# Patient Record
Sex: Male | Born: 1980 | Hispanic: Yes | Marital: Married | Smoking: Current every day smoker
Health system: Southern US, Community
[De-identification: ages and names within clinical notes are randomized; demographics above are authoritative.]

## PROBLEM LIST (undated history)

## (undated) DIAGNOSIS — S46219A Strain of muscle, fascia and tendon of other parts of biceps, unspecified arm, initial encounter: Secondary | ICD-10-CM

## (undated) HISTORY — PX: NO PAST SURGERIES: SHX2092

---

## 2001-02-28 ENCOUNTER — Emergency Department (HOSPITAL_COMMUNITY): Admission: EM | Admit: 2001-02-28 | Discharge: 2001-02-28 | Payer: Self-pay | Admitting: *Deleted

## 2008-05-12 ENCOUNTER — Emergency Department (HOSPITAL_COMMUNITY): Admission: EM | Admit: 2008-05-12 | Discharge: 2008-05-12 | Payer: Self-pay | Admitting: Emergency Medicine

## 2014-07-29 ENCOUNTER — Emergency Department (HOSPITAL_COMMUNITY)
Admission: EM | Admit: 2014-07-29 | Discharge: 2014-07-29 | Disposition: A | Discharge status: Home / Self Care | Payer: Self-pay | Attending: Emergency Medicine | Admitting: Emergency Medicine

## 2014-07-29 ENCOUNTER — Encounter (HOSPITAL_COMMUNITY): Payer: Self-pay | Admitting: Emergency Medicine

## 2014-07-29 DIAGNOSIS — S46201A Unspecified injury of muscle, fascia and tendon of other parts of biceps, right arm, initial encounter: Secondary | ICD-10-CM

## 2014-07-29 DIAGNOSIS — Y9389 Activity, other specified: Secondary | ICD-10-CM | POA: Insufficient documentation

## 2014-07-29 DIAGNOSIS — Y929 Unspecified place or not applicable: Secondary | ICD-10-CM | POA: Insufficient documentation

## 2014-07-29 DIAGNOSIS — S4991XA Unspecified injury of right shoulder and upper arm, initial encounter: Secondary | ICD-10-CM | POA: Insufficient documentation

## 2014-07-29 DIAGNOSIS — X58XXXA Exposure to other specified factors, initial encounter: Secondary | ICD-10-CM | POA: Insufficient documentation

## 2014-07-29 DIAGNOSIS — Y998 Other external cause status: Secondary | ICD-10-CM | POA: Insufficient documentation

## 2014-07-29 DIAGNOSIS — Z720 Tobacco use: Secondary | ICD-10-CM | POA: Insufficient documentation

## 2014-07-29 MED ORDER — OXYCODONE-ACETAMINOPHEN 5-325 MG PO TABS: 1.0000 | ORAL_TABLET | Freq: Four times a day (QID) | ORAL | Status: DC | PRN

## 2014-07-29 NOTE — Discharge Instructions (Signed)
Biceps Tendon Disruption (Distal) with Rehab The biceps tendon attaches the biceps muscle to the bones of the elbow and the shoulder. A distal biceps tendon disruption is a tear of this tendon at the end the attached near the elbow. A distal biceps tendon rupture is an uncommon injury. These injuries usually involve a complete tear of the tendon from the bone; however, partial tears are also possible. The bicep muscle works with other muscles to bend the elbow and rotate the palm upward (supinate). A complete biceps rupture will result in approximately a 30% decrease in elbow bending strength and a 40% decrease in one's ability to supinate the wrist. SYMPTOMS   Pain, tenderness, swelling, warmth, or redness at the elbow, usually in the front of the elbow.  Pain that worsens with flexion of the elbow against resistance and when straightening the elbow.  Bulge can be seen and felt in the arm.  Bruising (contusion) in the elbow or forearm after 24 hours.  Limited motion of the elbow.  Weakness with attempted elbow bending (lifting or carrying) or rotation of the wrist (like when using a screwdriver).  A crackling sound (crepitation) when the tendon or elbow is moved or touched. CAUSES  A biceps tendon rupture occurs when the tendon is subjected to a force that is greater than it can withstand, such as straightening the elbow while the biceps is contracted or direct trauma (rare). RISK INCREASES WITH:   Sports that involve contact, as well as throwing sports, gymnastics, weightlifting, and bodybuilding.  Heavy labor.  Poor strength and flexibility.  Failure to warm-up properly before activity. PREVENTION  Warm up and stretch appropriately before activity.  Maintain physical fitness:  Strength, flexibility, and endurance.  Cardiovascular fitness  Allow your body to recover between practices and competition.  Learn and use proper technique. PROGNOSIS  Surgery is usually  required to fix distal biceps tendon rupture. After surgery, a recovery period of 4 to 8 months can be expected to allow for healing and a return to sports.  RELATED COMPLICATIONS  Weakness of elbow bending and forearm rotation, especially if treated non-surgically.  Prolonged disability.  Re-rupture of the tendon after surgery.  Risks of surgery, including infection, bleeding, injury to nerves, elbow or wrist stiffness or loss of motion, and weakness of elbow bending or wrist rotation. TREATMENT  Treatment initially consists of ice and medication to help reduce pain and inflammation. A sling may also be worn to increase one's comfort. Surgery is required for a full recovery and return to sports. Surgery involves reattaching the tendon to the bone. Weakness can be expected if surgery is not performed; however, this may be acceptable for sedentary individuals. Surgery is usually followed by immobilization and rehabilitation exercises to regain strength and a full range of motion.  MEDICATION  If pain medication is necessary, nonsteroidal anti-inflammatory medications, such as aspirin and ibuprofen, or other minor pain relievers, such as acetaminophen, are often recommended.  Do not take pain medication for 7 days before surgery.  Prescription pain relievers may be given if deemed necessary by your caregiver. Use only as directed and only as much as you need. HEAT AND COLD  Cold treatment (icing) relieves pain and reduces inflammation. Cold treatment should be applied for 10 to 15 minutes every 2 to 3 hours for inflammation and pain and immediately after any activity that aggravates your symptoms. Use ice packs or an ice massage.  Heat treatment may be used prior to performing the stretching and strengthening  activities prescribed by your caregiver, physical therapist, or athletic trainer. Use a heat pack or a warm soak. SEEK MEDICAL CARE IF:   Symptoms get worse or do not improve in 2 weeks  despite treatment.  You experience pain, numbness, or coldness in the hand.  Blue, gray, or dark color appears in the fingernails.  Any of the following occur after surgery:  Increased pain, swelling, redness, drainage, or bleeding in the surgical area.  Signs of infection (headache, muscle aches, dizziness, or a general ill feeling with fever).  New, unexplained symptoms develop (drugs used in treatment may produce side effects). EXERCISES RANGE OF MOTION (ROM) AND STRETCHING EXERCISES - Biceps Tendon Disruption (Distal) Once your physician, physical therapist or athletic trainer has permitted you to discontinue using your brace or splint, you may begin to restore your elbow motion by using these exercises. Beginning these exercises before your provider's approval may result in delayed healing. While completing these exercises, remember:   Restoring tissue flexibility helps normal motion to return to the joints. This allows healthier, less painful movement and activity.  An effective stretch should be held for at least 30 seconds.  A stretch should never be painful. You should only feel a gentle lengthening or release in the stretched tissue. RANGE OF MOTION - Extension  Hold your right / left arm at your side and straighten your elbow as far as you can using your right / left arm muscles.  Straighten the right / left elbow farther by gently pushing down on your forearm until you feel a gentle stretch on the inside of your elbow. Hold this position for __________ seconds.  Slowly return to the starting position. Repeat __________ times. Complete this exercise __________ times per day.  RANGE OF MOTION - Flexion  Hold your right / left arm at your side and bend your elbow as far as you can using your right / left arm muscles.  Bend the right / left elbow farther by gently pushing up on your forearm until you feel a gentle stretch on the outside of your elbow. Hold this position for  __________ seconds.  Slowly return to the starting position. Repeat __________ times. Complete this exercise __________ times per day.  RANGE OF MOTION - Supination, Active   Stand or sit with your elbows at your side. Bend your right / left elbow to 90 degrees.  Turn your palm upward until you feel a gentle stretch on the inside of your forearm.  Hold this position for __________ seconds. Slowly release and return to the starting position. Repeat __________ times. Complete this stretch __________ times per day.  RANGE OF MOTION - Pronation, Active   Stand or sit with your elbows at your side. Bend your right / left elbow to 90 degrees.  Turn your palm downward until you feel a gentle stretch on the top of your forearm.  Hold this position for __________ seconds. Slowly release and return to the starting position. Repeat __________ times. Complete this stretch __________ times per day.  STRENGTHENING EXERCISES - Biceps Tendon Disruption (Distal) Once your physician, physical therapist, or athletic trainer has permitted you to discontinue using your brace or splint, you may begin restoring your arm strength by using these exercises. Beginning these before your provider's approval may result in delayed healing. While completing these exercises, remember:   Muscles can gain both the endurance and the strength needed for everyday activities through controlled exercises.  Complete these exercises as instructed by your physician,  physical therapist, or athletic trainer. Progress the resistance and repetitions only as guided.  You may experience muscle soreness or fatigue, but the pain or discomfort you are trying to eliminate should never worsen during these exercises. If this pain does worsen, stop and make certain you are following the directions exactly. If the pain is still present after adjustments, discontinue the exercise until you can discuss the trouble with your clinician. STRENGTH -  Elbow Flexors, Isometric   Stand or sit upright on a firm surface. Place your right / left arm so that your hand is palm-up and at the height of your waist.  Place your opposite hand on top of your forearm. Gently push down as your right / left arm resists. Push as hard as you can with both arms without causing any pain or movement at your right / left elbow. Hold this stationary position for __________ seconds.  Gradually release the tension in both arms. Allow your muscles to relax completely before repeating. Repeat __________ times. Complete this exercise __________ times per day. STRENGTH - Elbow Extensors, Isometric   Stand or sit upright on a firm surface. Place your right / left arm so that your palm faces your abdomen and it is at the height of your waist.  Place your opposite hand on the underside of your forearm. Gently push up as your right / left arm resists. Push as hard as you can with both arms without causing any pain or movement at your right / left elbow. Hold this stationary position for __________ seconds.  Gradually release the tension in both arms. Allow your muscles to relax completely before repeating. Repeat __________ times. Complete this exercise __________ times per day. STRENGTH - Elbow Flexors, Supinated  With good posture, stand or sit on a firm chair without armrests. Allow your right / left arm to rest at your side with your palm facing forward.  Holding a __________ weight or gripping a rubber exercise band/tubing, bring your right / left hand toward your shoulder.  Allow your muscles to control the resistance as your hand returns to your side. Repeat __________ times. Complete this exercise __________ times per day.  STRENGTH - Elbow Flexors, Neutral  With good posture, stand or sit on a firm chair without armrests. Allow your right / left arm to rest at your side with your thumb facing forward.  Holding a __________ weight or gripping a rubber exercise  band/tubing, bring your right / left hand toward your shoulder.  Allow your muscles to control the resistance as your hand returns to your side. Repeat __________ times. Complete this exercise __________ times per day.  STRENGTH - Elbow Extensors  Lie on your back. Extend your right / left elbow into the air, pointing it toward the ceiling. Brace your arm with your opposite hand.*  Holding a __________ weight in your hand, slowly straighten your right / left elbow.  Allow your muscles to control the weight as your hand returns to its starting position. Repeat __________ times. Complete this exercise __________ times per day. *You may also stand with your elbow overhead and pointed toward the ceiling and supported by your opposite hand. STRENGTH - Elbow Extensors, Dynamic  With good posture, stand or sit on a firm chair without armrests. Keeping your upper arms at your side, bring both hands up to your right / left shoulder while gripping a rubber exercise band/tubing. Your right / left hand should be just below the other hand.  Straighten your  right / left elbow. Hold for __________ seconds.  Allow your muscles to control the rubber exercise band/tubing as your hand returns to your shoulder. Repeat __________ times. Complete this exercise __________ times per day. STRENGTH - Forearm Supinators   Sit with your right / left forearm supported on a table, keeping your elbow below shoulder height. Rest your hand over the edge, palm down.  Gently grip a hammer or a soup ladle.  Without moving your elbow, slowly turn your palm and hand upward to a "thumbs-up" position.  Hold this position for __________ seconds. Slowly return to the starting position. Repeat __________ times. Complete this exercise __________ times per day.  STRENGTH - Forearm Pronators   Sit with your right / left forearm supported on a table, keeping your elbow below shoulder height. Rest your hand over the edge, palm  up.  Gently grip a hammer or a soup ladle.  Without moving your elbow, slowly turn your palm and hand upward to a "thumbs-up" position.  Hold this position for __________ seconds. Slowly return to the starting position. Repeat __________ times. Complete this exercise __________ times per day.  Document Released: 01/15/2005 Document Revised: 04/09/2011 Document Reviewed: 04/29/2008 [facility] Patient Information 2015 [place], [place]. This information is not intended to replace advice given to you by your health care provider. Make sure you discuss any questions you have with your health care provider.   Please follow-up with Dr. Eulah Pont for further evaluation and management. His monitor for new or worsening signs or symptoms return to the emergency room if any present.

## 2014-07-29 NOTE — ED Provider Notes (Signed)
CSN: [id number]     Arrival date & time 07/29/14  2201 History   This chart was scribed for non-physician practitioner, Eyvonne Mechanic, PA-C working with Lorre Nick, MD, by Abel Presto, ED Scribe. This patient was seen in room TR02C/TR02C and the patient's care was started at 10:40 PM.     Chief Complaint  Patient presents with  . Arm Pain     The history is provided by the patient. No language interpreter was used.   HPI Comments: Gerald Logan is a 34 y.o. male who presents to the Emergency Department complaining of right upper arm pain with onset 3 hours ago. Pt states he was lifting a piece of plywood at onset and heard a popping sound, after which his fingers became numb and tingly. Pt is unable to completely extend arm secondary to pain. Patient reports the numbness and tingling has improved, minimal pain at baseline. Patient denies previous injury to the right arm. Pt denies any other injury or complaint.   History reviewed. No pertinent past medical history. History reviewed. No pertinent past surgical history. No family history on file. History  Substance Use Topics  . Smoking status: Current Every Day Smoker  . Smokeless tobacco: Not on file  . Alcohol Use: Yes    Review of Systems  All other systems reviewed and are negative.   Allergies  Review of patient's allergies indicates no known allergies.  Home Medications   Prior to Admission medications   Medication Sig Start Date End Date Taking? Authorizing Provider  oxyCODONE-acetaminophen (PERCOCET/ROXICET) 5-325 MG per tablet Take 1 tablet by mouth every 6 (six) hours as needed for severe pain. 07/29/14   Katia Hannen, PA-C   BP 144/90 mmHg  Pulse 56  Temp(Src) 97.4 F (36.3 C)  Resp 20  SpO2 99%   Physical Exam  Constitutional: He is oriented to person, place, and time. He appears well-developed and well-nourished.  HENT:  Head: Normocephalic.  Eyes: Conjunctivae are normal.  Neck: Normal range of  motion. Neck supple.  Pulmonary/Chest: Effort normal.  Musculoskeletal: Normal range of motion.  Asymmetry of the biceps bilateral. Right distal bicep high riding tender to palpation. Patient has full flexion of the affected elbow and shoulder. Painful extension of the elbow. Strength 4 out of 5 elbow flexion. Shoulder nontender 5 out of 5 flexion strength. Distal sensation perfusion intact, strength 5 out of 5  Neurological: He is alert and oriented to person, place, and time.  Skin: Skin is warm and dry.  Psychiatric: He has a normal mood and affect. His behavior is normal.  Nursing note and vitals reviewed.   ED Course  Procedures (including critical care time) DIAGNOSTIC STUDIES: Oxygen Saturation is 98% on room air, normal by my interpretation.    COORDINATION OF CARE: 10:44 PM Discussed treatment plan with patient at beside, the patient agrees with the plan and has no further questions at this time.   Labs Review Labs Reviewed - No data to display  Imaging Review No results found.   EKG Interpretation None      MDM   Final diagnoses:  Unsp injury of musc/fasc/tend prt biceps, right arm, init    Patient presents with likely rupture of biceps tendon and insertion. Patient maintains strength of extremity, although it is reduced. 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 Patient will be placed in a sling, given pain medication, and consult information for on-call orthopedic surgeon Dr. Eulah Pont. He is instructed  to contact Dr. Eulah PontMurphy first thing tomorrow morning for further evaluation and management. Patient is given strict return precautions the event new or worsening signs or symptoms present. Patient verbalized understanding and agreement with today's plan has no further questions concerns.      I personally performed the services described in this documentation, which was scribed in my presence. The recorded information has  been reviewed and is accurate.     Eyvonne MechanicJeffrey Doral Digangi, PA-C 07/29/14 2325  Lorre NickAnthony Allen, MD 08/02/14 [phone]

## 2014-07-29 NOTE — ED Notes (Signed)
Pt. reports right upper arm injury with pain / stiffness this evening while lifting a heavy plywood .

## 2014-07-30 DIAGNOSIS — S46219A Strain of muscle, fascia and tendon of other parts of biceps, unspecified arm, initial encounter: Secondary | ICD-10-CM

## 2014-07-30 HISTORY — DX: Strain of muscle, fascia and tendon of other parts of biceps, unspecified arm, initial encounter: S46.219A

## 2014-08-03 ENCOUNTER — Encounter (HOSPITAL_BASED_OUTPATIENT_CLINIC_OR_DEPARTMENT_OTHER): Payer: Self-pay | Admitting: *Deleted

## 2014-08-03 NOTE — H&P (Signed)
  PREOPERATIVE H&P  Chief Complaint: SPONTANEOUS RUPTURE OF OTHER TENDONS,RIGHT UPPER ARM  HPI: Gerald DownerJavier Gotto is a 34 y.o. male who presents for preoperative history and physical with a diagnosis of SPONTANEOUS RUPTURE OF OTHER TENDONS,RIGHT UPPER ARM. Symptoms are rated as moderate to severe, and have been worsening.  This is significantly impairing activities of daily living.  He has elected for surgical management.   Past Medical History  Diagnosis Date  . Rupture of distal biceps tendon 07/2014    right   Past Surgical History  Procedure Laterality Date  . No past surgeries     History   Social History  . Marital Status: Married    Spouse Name: N/A  . Number of Children: N/A  . Years of Education: N/A   Social History Main Topics  . Smoking status: Current Every Day Smoker -- .5 years    Types: Cigarettes  . Smokeless tobacco: Never Used     Comment: 1-2 cig./week  . Alcohol Use: Yes     Comment: socially  . Drug Use: No  . Sexual Activity: Not on file   Other Topics Concern  . None   Social History Narrative   History reviewed. No pertinent family history. No Known Allergies Prior to Admission medications   Not on File     Positive ROS: All other systems have been reviewed and were otherwise negative with the exception of those mentioned in the HPI and as above.  Physical Exam: General: Alert, no acute distress Cardiovascular: No pedal edema Respiratory: No cyanosis, no use of accessory musculature GI: No organomegaly, abdomen is soft and non-tender Skin: No lesions in the area of chief complaint Neurologic: Sensation intact distally Psychiatric: Patient is competent for consent with normal mood and affect Lymphatic: No axillary or cervical lymphadenopathy  MUSCULOSKELETAL: Swelling of the right upper arm, diffusely tender to palpation, palpable deformity/retraction of the biceps tendon, very weak supination and flexion, sensation intact with 2+ distal  pulses  Assessment: SPONTANEOUS RUPTURE OF OTHER TENDONS,RIGHT UPPER ARM  Plan: Plan for Procedure(s): RIGHT DISTAL BICEPS TENDON REPAIR  The risks benefits and alternatives were discussed with the patient including but not limited to the risks of nonoperative treatment, versus surgical intervention including infection, bleeding, nerve injury,  blood clots, cardiopulmonary complications, morbidity, mortality, among others, and they were willing to proceed.   Lynann BolognaKelly,Shina Wass Marie, PA-C  08/03/2014 1:53 PM

## 2014-08-06 ENCOUNTER — Encounter (HOSPITAL_BASED_OUTPATIENT_CLINIC_OR_DEPARTMENT_OTHER): Payer: Self-pay | Admitting: Anesthesiology

## 2014-08-06 ENCOUNTER — Ambulatory Visit (HOSPITAL_BASED_OUTPATIENT_CLINIC_OR_DEPARTMENT_OTHER): Payer: Self-pay | Admitting: Anesthesiology

## 2014-08-06 ENCOUNTER — Ambulatory Visit (HOSPITAL_BASED_OUTPATIENT_CLINIC_OR_DEPARTMENT_OTHER)
Admission: RE | Admit: 2014-08-06 | Discharge: 2014-08-06 | Disposition: A | Discharge status: Home / Self Care | Payer: Self-pay | Source: Ambulatory Visit | Attending: Orthopedic Surgery | Admitting: Orthopedic Surgery

## 2014-08-06 ENCOUNTER — Encounter (HOSPITAL_BASED_OUTPATIENT_CLINIC_OR_DEPARTMENT_OTHER)
Admission: RE | Disposition: A | Discharge status: Home / Self Care | Payer: Self-pay | Source: Ambulatory Visit | Attending: Orthopedic Surgery

## 2014-08-06 DIAGNOSIS — M66821 Spontaneous rupture of other tendons, right upper arm: Secondary | ICD-10-CM | POA: Insufficient documentation

## 2014-08-06 DIAGNOSIS — F17210 Nicotine dependence, cigarettes, uncomplicated: Secondary | ICD-10-CM | POA: Insufficient documentation

## 2014-08-06 HISTORY — PX: DISTAL BICEPS TENDON REPAIR: SHX1461

## 2014-08-06 HISTORY — DX: Strain of muscle, fascia and tendon of other parts of biceps, unspecified arm, initial encounter: S46.219A

## 2014-08-06 LAB — POCT HEMOGLOBIN-HEMACUE: HEMOGLOBIN: 14.4 g/dL (ref 13.0–17.0)

## 2014-08-06 SURGERY — REPAIR, TENDON, BICEPS, DISTAL
Anesthesia: General | Site: Elbow | Laterality: Right

## 2014-08-06 MED ORDER — ASPIRIN EC 81 MG PO TBEC: 81.0000 mg | DELAYED_RELEASE_TABLET | Freq: Every day | ORAL | Status: AC

## 2014-08-06 MED ORDER — ONDANSETRON HCL 4 MG PO TABS: 4.0000 mg | ORAL_TABLET | Freq: Three times a day (TID) | ORAL | Status: AC | PRN

## 2014-08-06 MED ORDER — SODIUM CHLORIDE 0.9 % IR SOLN
Status: DC | PRN
  Administered 2014-08-06: 1000 mL

## 2014-08-06 MED ORDER — PROPOFOL 10 MG/ML IV BOLUS
INTRAVENOUS | Status: DC | PRN
  Administered 2014-08-06: 200 mg via INTRAVENOUS

## 2014-08-06 MED ORDER — LIDOCAINE HCL (CARDIAC) 20 MG/ML IV SOLN
INTRAVENOUS | Status: DC | PRN
  Administered 2014-08-06: 50 mg via INTRAVENOUS

## 2014-08-06 MED ORDER — CEFAZOLIN SODIUM-DEXTROSE 2-3 GM-% IV SOLR
2.0000 g | INTRAVENOUS | Status: AC
  Administered 2014-08-06: 2 g via INTRAVENOUS

## 2014-08-06 MED ORDER — CEFAZOLIN SODIUM-DEXTROSE 2-3 GM-% IV SOLR
INTRAVENOUS | Status: AC
  Filled 2014-08-06: qty 50

## 2014-08-06 MED ORDER — POTASSIUM CHLORIDE IN NACL 20-0.45 MEQ/L-% IV SOLN: INTRAVENOUS | Status: DC

## 2014-08-06 MED ORDER — MIDAZOLAM HCL 2 MG/2ML IJ SOLN
1.0000 mg | INTRAMUSCULAR | Status: DC | PRN
  Administered 2014-08-06: 2 mg via INTRAVENOUS

## 2014-08-06 MED ORDER — ROPIVACAINE HCL 5 MG/ML IJ SOLN
INTRAMUSCULAR | Status: DC | PRN
  Administered 2014-08-06: 25 mL via PERINEURAL

## 2014-08-06 MED ORDER — DOCUSATE SODIUM 100 MG PO CAPS: 100.0000 mg | ORAL_CAPSULE | Freq: Two times a day (BID) | ORAL | Status: AC

## 2014-08-06 MED ORDER — FENTANYL CITRATE (PF) 100 MCG/2ML IJ SOLN
50.0000 ug | INTRAMUSCULAR | Status: DC | PRN
  Administered 2014-08-06: 100 ug via INTRAVENOUS

## 2014-08-06 MED ORDER — METHOCARBAMOL 500 MG PO TABS: 500.0000 mg | ORAL_TABLET | Freq: Four times a day (QID) | ORAL | Status: AC

## 2014-08-06 MED ORDER — MIDAZOLAM HCL 2 MG/2ML IJ SOLN
INTRAMUSCULAR | Status: AC
  Filled 2014-08-06: qty 2

## 2014-08-06 MED ORDER — GLYCOPYRROLATE 0.2 MG/ML IJ SOLN: 0.2000 mg | Freq: Once | INTRAMUSCULAR | Status: DC | PRN

## 2014-08-06 MED ORDER — DEXAMETHASONE SODIUM PHOSPHATE 4 MG/ML IJ SOLN
INTRAMUSCULAR | Status: DC | PRN
  Administered 2014-08-06: 10 mg via INTRAVENOUS

## 2014-08-06 MED ORDER — ACETAMINOPHEN 500 MG PO TABS
1000.0000 mg | ORAL_TABLET | Freq: Once | ORAL | Status: AC
  Administered 2014-08-06: 1000 mg via ORAL

## 2014-08-06 MED ORDER — GLYCOPYRROLATE 0.2 MG/ML IJ SOLN
INTRAMUSCULAR | Status: DC | PRN
  Administered 2014-08-06: 0.2 mg via INTRAVENOUS

## 2014-08-06 MED ORDER — FENTANYL CITRATE (PF) 100 MCG/2ML IJ SOLN
INTRAMUSCULAR | Status: AC
  Filled 2014-08-06: qty 2

## 2014-08-06 MED ORDER — OXYCODONE-ACETAMINOPHEN 5-325 MG PO TABS: 1.0000 | ORAL_TABLET | ORAL | Status: AC | PRN

## 2014-08-06 MED ORDER — FENTANYL CITRATE (PF) 100 MCG/2ML IJ SOLN
INTRAMUSCULAR | Status: AC
  Filled 2014-08-06: qty 6

## 2014-08-06 MED ORDER — SCOPOLAMINE 1 MG/3DAYS TD PT72: 1.0000 | MEDICATED_PATCH | Freq: Once | TRANSDERMAL | Status: DC | PRN

## 2014-08-06 MED ORDER — LACTATED RINGERS IV SOLN
INTRAVENOUS | Status: DC
  Administered 2014-08-06 (×3): via INTRAVENOUS

## 2014-08-06 MED ORDER — CHLORHEXIDINE GLUCONATE 4 % EX LIQD: 60.0000 mL | Freq: Once | CUTANEOUS | Status: DC

## 2014-08-06 MED ORDER — ONDANSETRON HCL 4 MG/2ML IJ SOLN
INTRAMUSCULAR | Status: DC | PRN
  Administered 2014-08-06: 4 mg via INTRAVENOUS

## 2014-08-06 MED ORDER — ACETAMINOPHEN 500 MG PO TABS
ORAL_TABLET | ORAL | Status: AC
  Filled 2014-08-06: qty 2

## 2014-08-06 SURGICAL SUPPLY — 52 items
BANDAGE ELASTIC 4 VELCRO ST LF (GAUZE/BANDAGES/DRESSINGS) ×6 IMPLANT
BLADE SURG 15 STRL LF DISP TIS (BLADE) ×1 IMPLANT
BLADE SURG 15 STRL SS (BLADE) ×3
BNDG CMPR 9X4 STRL LF SNTH (GAUZE/BANDAGES/DRESSINGS) ×1
BNDG COHESIVE 4X5 TAN STRL (GAUZE/BANDAGES/DRESSINGS) ×3 IMPLANT
BNDG ESMARK 4X9 LF (GAUZE/BANDAGES/DRESSINGS) ×3 IMPLANT
CANISTER SUCT 1200ML W/VALVE (MISCELLANEOUS) ×3 IMPLANT
CHLORAPREP W/TINT 26ML (MISCELLANEOUS) ×3 IMPLANT
COVER BACK TABLE 60X90IN (DRAPES) ×3 IMPLANT
CUFF TOURNIQUET SINGLE 18IN (TOURNIQUET CUFF) ×2 IMPLANT
DRAPE EXTREMITY T 121X128X90 (DRAPE) ×3 IMPLANT
DRAPE OEC MINIVIEW 54X84 (DRAPES) ×2 IMPLANT
DRAPE U 20/CS (DRAPES) ×3 IMPLANT
DRAPE U-SHAPE 47X51 STRL (DRAPES) ×3 IMPLANT
DRSG EMULSION OIL 3X3 NADH (GAUZE/BANDAGES/DRESSINGS) ×2 IMPLANT
DRSG PAD ABDOMINAL 8X10 ST (GAUZE/BANDAGES/DRESSINGS) ×3 IMPLANT
ELECT REM PT RETURN 9FT ADLT (ELECTROSURGICAL) ×3
ELECTRODE REM PT RTRN 9FT ADLT (ELECTROSURGICAL) ×1 IMPLANT
GAUZE SPONGE 4X4 12PLY STRL (GAUZE/BANDAGES/DRESSINGS) ×3 IMPLANT
GLOVE BIO SURGEON STRL SZ7 (GLOVE) ×3 IMPLANT
GLOVE BIO SURGEON STRL SZ7.5 (GLOVE) ×3 IMPLANT
GLOVE BIOGEL PI IND STRL 7.0 (GLOVE) ×1 IMPLANT
GLOVE BIOGEL PI IND STRL 8 (GLOVE) ×2 IMPLANT
GLOVE BIOGEL PI INDICATOR 7.0 (GLOVE) ×2
GLOVE BIOGEL PI INDICATOR 8 (GLOVE) ×4
GOWN STRL REUS W/ TWL LRG LVL3 (GOWN DISPOSABLE) ×3 IMPLANT
GOWN STRL REUS W/TWL LRG LVL3 (GOWN DISPOSABLE) ×9
IMPL TOGGLELOC ELBOW SYSTEM (Orthopedic Implant) IMPLANT
IMPLANT TOGGLELOC ELBOW SYSTEM (Orthopedic Implant) ×3 IMPLANT
NS IRRIG 1000ML POUR BTL (IV SOLUTION) ×3 IMPLANT
PACK BASIN DAY SURGERY FS (CUSTOM PROCEDURE TRAY) ×3 IMPLANT
PAD CAST 4YDX4 CTTN HI CHSV (CAST SUPPLIES) ×3 IMPLANT
PADDING CAST ABS 4INX4YD NS (CAST SUPPLIES) ×2
PADDING CAST ABS COTTON 4X4 ST (CAST SUPPLIES) ×1 IMPLANT
PADDING CAST COTTON 4X4 STRL (CAST SUPPLIES) ×9
PENCIL BUTTON HOLSTER BLD 10FT (ELECTRODE) ×3 IMPLANT
SLEEVE SCD COMPRESS KNEE MED (MISCELLANEOUS) ×2 IMPLANT
SLING ARM LRG ADULT FOAM STRAP (SOFTGOODS) ×2 IMPLANT
SUCTION FRAZIER TIP 10 FR DISP (SUCTIONS) ×3 IMPLANT
SUT ETHILON 3 0 PS 1 (SUTURE) ×3 IMPLANT
SUT FIBERWIRE #2 38 T-5 BLUE (SUTURE) ×3
SUT VIC AB 0 CT1 27 (SUTURE) ×3
SUT VIC AB 0 CT1 27XBRD ANBCTR (SUTURE) ×1 IMPLANT
SUT VIC AB 2-0 SH 27 (SUTURE) ×3
SUT VIC AB 2-0 SH 27XBRD (SUTURE) ×1 IMPLANT
SUTURE FIBERWR #2 38 T-5 BLUE (SUTURE) IMPLANT
SYR BULB 3OZ (MISCELLANEOUS) ×3 IMPLANT
TOWEL OR 17X24 6PK STRL BLUE (TOWEL DISPOSABLE) ×5 IMPLANT
TOWEL OR NON WOVEN STRL DISP B (DISPOSABLE) ×3 IMPLANT
TUBE CONNECTING 20'X1/4 (TUBING) ×1
TUBE CONNECTING 20X1/4 (TUBING) ×2 IMPLANT
UNDERPAD 30X30 (UNDERPADS AND DIAPERS) ×3 IMPLANT

## 2014-08-06 NOTE — Discharge Instructions (Signed)
Keep splint clean and dry till follow up  Non-weight bearing in the R arm  Sling at all times  ASA 81mg  daily for DVT prophylaxis   Post Anesthesia Home Care Instructions  Activity: Get plenty of rest for the remainder of the day. A responsible adult should stay with you for 24 hours following the procedure.  For the next 24 hours, DO NOT: -Drive a car -Advertising copywriterperate machinery -Drink alcoholic beverages -Take any medication unless instructed by your physician -Make any legal decisions or sign important papers.  Meals: Start with liquid foods such as gelatin or soup. Progress to regular foods as tolerated. Avoid greasy, spicy, heavy foods. If nausea and/or vomiting occur, drink only clear liquids until the nausea and/or vomiting subsides. Call your physician if vomiting continues.  Special Instructions/Symptoms: Your throat may feel dry or sore from the anesthesia or the breathing tube placed in your throat during surgery. If this causes discomfort, gargle with warm salt water. The discomfort should disappear within 24 hours.  If you had a scopolamine patch placed behind your ear for the management of post- operative nausea and/or vomiting:  1. The medication in the patch is effective for 72 hours, after which it should be removed.  Wrap patch in a tissue and discard in the trash. Wash hands thoroughly with soap and water. 2. You may remove the patch earlier than 72 hours if you experience unpleasant side effects which may include dry mouth, dizziness or visual disturbances. 3. Avoid touching the patch. Wash your hands with soap and water after contact with the patch.     Regional Anesthesia Blocks  1. Numbness or the inability to move the "blocked" extremity may last from 3-48 hours after placement. The length of time depends on the medication injected and your individual response to the medication. If the numbness is not going away after 48 hours, call your surgeon.  2. The extremity  that is blocked will need to be protected until the numbness is gone and the  Strength has returned. Because you cannot feel it, you will need to take extra care to avoid injury. Because it may be weak, you may have difficulty moving it or using it. You may not know what position it is in without looking at it while the block is in effect.  3. For blocks in the legs and feet, returning to weight bearing and walking needs to be done carefully. You will need to wait until the numbness is entirely gone and the strength has returned. You should be able to move your leg and foot normally before you try and bear weight or walk. You will need someone to be with you when you first try to ensure you do not fall and possibly risk injury.  4. Bruising and tenderness at the needle site are common side effects and will resolve in a few days.  5. Persistent numbness or new problems with movement should be communicated to the surgeon or the [facility]  [phone])/ [facility] [phone]).

## 2014-08-06 NOTE — Op Note (Signed)
08/06/2014  2:38 PM  PATIENT:  Gerald Logan    PRE-OPERATIVE DIAGNOSIS:  SPONTANEOUS RUPTURE OF OTHER TENDONS,RIGHT UPPER ARM  POST-OPERATIVE DIAGNOSIS:  Same  PROCEDURE:  RIGHT DISTAL BICEPS TENDON REPAIR  SURGEON:  Meeah Totino, Jewel BaizeIMOTHY D, MD  ASSISTANT: Janalee DaneBrittney Kelly, PA-C, She was present and scrubbed throughout the case, critical for completion in a timely fashion, and for retraction, instrumentation, and closure.   ANESTHESIA:   gen   PREOPERATIVE INDICATIONS:  Gerald Logan is a  34 y.o. male with a diagnosis of SPONTANEOUS RUPTURE OF OTHER TENDONS,RIGHT UPPER ARM who failed conservative measures and elected for surgical management.    The risks benefits and alternatives were discussed with the patient preoperatively including but not limited to the risks of infection, bleeding, nerve injury, cardiopulmonary complications, the need for revision surgery, among others, and the patient was willing to proceed.  OPERATIVE IMPLANTS: biomet zip loop  OPERATIVE FINDINGS: tendon rupture  BLOOD LOSS: min  COMPLICATIONS: none  TOURNIQUET TIME: 90min  OPERATIVE PROCEDURE:  Patient was identified in the preoperative holding area and site was marked by me He was transported to the operating theater and placed on the table in supine position taking care to pad all bony prominences. After a preincinduction time out anesthesia was induced. The right upper extremity was prepped and draped in normal sterile fashion and a pre-incision timeout was performed. He received ancef for preoperative antibiotics.   I made a longitudinal incision over his anterior forearm and the approach of Sherilyn CooterHenry down to his radial tuberosity. I kept his arm supinated throughout the procedure.  I identified several veins and protected these.  Identified his radial tuberosity and it was found to be bare without tendon attachment. Cleared off the remaining tendon fibers for future passage of our zip loop button. Next I  turned my attention proximally I bluntly dissected out from this origin and identified his radial aspect of his biceps tendon.  I delivered this out of the wound placed mouth clamp and whipstitched it up and back securing the zip limited to zip loop to the tendon.  Next I drilled a guide pin through the radial tuberosity is I took multiple x-rays and was happy with placement and alignment.  I then drilled the tunnel for the zip loop button.  Next I selected an 8 mm reamer to drill unicortical he to dunk the tendon tended into the tunnel.  I passed the button through the cortex confirm that it flipped with multiple x-rays. I then walked the tendon down into the tunnel tensioning the zip loop. There was at least a centimeter of tendon dumped into the tunnel.  I then over tied the zip loop stitches.  I then thoroughly irrigated the wound and closed the skin with a nylon stitch. Sterile dressing was applied he was splinted at 90.  POST OPERATIVE PLAN: splint full time. Ambulate for dvt px.     This note was generated using a template and dragon dictation system. In light of that, I have reviewed the note and all aspects of it are applicable to this case. Any dictation errors are due to the computerized dictation system.

## 2014-08-06 NOTE — Progress Notes (Signed)
Assisted Dr. Rose with right, ultrasound guided, supraclavicular block. Side rails up, monitors on throughout procedure. See vital signs in flow sheet. Tolerated Procedure well. 

## 2014-08-06 NOTE — Transfer of Care (Signed)
Immediate Anesthesia Transfer of Care Note  Patient: Gerald Logan  Procedure(s) Performed: Procedure(s): RIGHT DISTAL BICEPS TENDON REPAIR (Right)  Patient Location: PACU  Anesthesia Type:GA combined with regional for post-op pain  Level of Consciousness: sedated  Airway & Oxygen Therapy: Patient Spontanous Breathing and Patient connected to face mask oxygen  Post-op Assessment: Report given to RN and Post -op Vital signs reviewed and stable  Post vital signs: Reviewed and stable  Last Vitals:  Filed Vitals:   08/06/14 1500  BP: 117/75  Pulse: 58  Temp:   Resp:     Complications: No apparent anesthesia complications

## 2014-08-06 NOTE — Anesthesia Procedure Notes (Addendum)
Anesthesia Regional Block:  Supraclavicular block  Pre-Anesthetic Checklist: ,, timeout performed, Correct Patient, Correct Site, Correct Laterality, Correct Procedure, Correct Position, site marked, Risks and benefits discussed,  Surgical consent,  Pre-op evaluation,  At surgeon's request and post-op pain management  Laterality: Right  Prep: chloraprep       Needles:  Injection technique: Single-shot  Needle Type: Echogenic Stimulator Needle     Needle Length: 9cm 9 cm Needle Gauge: 21 and 21 G    Additional Needles:  Procedures: ultrasound guided (picture in chart) Supraclavicular block Narrative:  Injection made incrementally with aspirations every 5 mL.  Performed by: Personally   Additional Notes: Risks, benefits and alternative to block explained extensively.  Patient tolerated procedure well, without complications.   Procedure Name: LMA Insertion Date/Time: 08/06/2014 12:55 PM Performed by: Genevieve NorlanderLINKA, Holley Kocurek L Pre-anesthesia Checklist: Patient identified, Emergency Drugs available, Suction available and Patient being monitored Patient Re-evaluated:Patient Re-evaluated prior to inductionOxygen Delivery Method: Circle System Utilized Preoxygenation: Pre-oxygenation with 100% oxygen Intubation Type: IV induction Ventilation: Mask ventilation without difficulty LMA: LMA inserted LMA Size: 5.0 Number of attempts: 1 Airway Equipment and Method: Bite block Placement Confirmation: positive ETCO2 Tube secured with: Tape Dental Injury: Teeth and Oropharynx as per pre-operative assessment

## 2014-08-06 NOTE — Anesthesia Preprocedure Evaluation (Signed)
Anesthesia Evaluation  Patient identified by MRN, date of birth, ID band Patient awake    Reviewed: Allergy & Precautions, NPO status , Patient's Chart, lab work & pertinent test results  Airway Mallampati: II  TM Distance: >3 FB Neck ROM: Full    Dental no notable dental hx.    Pulmonary Current Smoker,  breath sounds clear to auscultation  Pulmonary exam normal       Cardiovascular negative cardio ROS Normal cardiovascular examRhythm:Regular Rate:Normal     Neuro/Psych negative neurological ROS  negative psych ROS   GI/Hepatic negative GI ROS, Neg liver ROS,   Endo/Other  negative endocrine ROS  Renal/GU negative Renal ROS  negative genitourinary   Musculoskeletal negative musculoskeletal ROS (+)   Abdominal   Peds negative pediatric ROS (+)  Hematology negative hematology ROS (+)   Anesthesia Other Findings   Reproductive/Obstetrics negative OB ROS                             Anesthesia Physical Anesthesia Plan  ASA: II  Anesthesia Plan: General   Post-op Pain Management:    Induction: Intravenous  Airway Management Planned: LMA  Additional Equipment:   Intra-op Plan:   Post-operative Plan: Extubation in OR  Informed Consent: I have reviewed the patients History and Physical, chart, labs and discussed the procedure including the risks, benefits and alternatives for the proposed anesthesia with the patient or authorized representative who has indicated his/her understanding and acceptance.   Dental advisory given  Plan Discussed with: CRNA and Surgeon  Anesthesia Plan Comments:         Anesthesia Quick Evaluation

## 2014-08-06 NOTE — Interval H&P Note (Signed)
History and Physical Interval Note:  08/06/2014 7:57 AM  Gerald Logan  has presented today for surgery, with the diagnosis of SPONTANEOUS RUPTURE OF OTHER TENDONS,RIGHT UPPER ARM  The various methods of treatment have been discussed with the patient and family. After consideration of risks, benefits and other options for treatment, the patient has consented to  Procedure(s): RIGHT DISTAL BICEPS TENDON REPAIR (Right) as a surgical intervention .  The patient's history has been reviewed, patient examined, no change in status, stable for surgery.  I have reviewed the patient's chart and labs.  Questions were answered to the patient's satisfaction.     Shawanna Zanders D

## 2014-08-06 NOTE — Anesthesia Postprocedure Evaluation (Signed)
  Anesthesia Post-op Note  Patient: Gerald Logan  Procedure(s) Performed: Procedure(s): RIGHT DISTAL BICEPS TENDON REPAIR (Right)  Patient Location: PACU  Anesthesia Type:GA combined with regional for post-op pain  Level of Consciousness: awake and alert   Airway and Oxygen Therapy: Patient Spontanous Breathing  Post-op Pain: none  Post-op Assessment: Post-op Vital signs reviewed              Post-op Vital Signs: Reviewed  Last Vitals:  Filed Vitals:   08/06/14 1530  BP: 115/55  Pulse: 44  Temp:   Resp: 10    Complications: No apparent anesthesia complications

## 2014-08-09 ENCOUNTER — Encounter (HOSPITAL_BASED_OUTPATIENT_CLINIC_OR_DEPARTMENT_OTHER): Payer: Self-pay | Admitting: Orthopedic Surgery

## 2016-08-26 ENCOUNTER — Encounter (HOSPITAL_COMMUNITY): Payer: Self-pay | Admitting: Emergency Medicine

## 2016-08-26 ENCOUNTER — Ambulatory Visit (HOSPITAL_COMMUNITY)
Admission: EM | Admit: 2016-08-26 | Discharge: 2016-08-26 | Disposition: A | Discharge status: Home / Self Care | Payer: Self-pay | Attending: Internal Medicine | Admitting: Internal Medicine

## 2016-08-26 DIAGNOSIS — T1592XA Foreign body on external eye, part unspecified, left eye, initial encounter: Secondary | ICD-10-CM

## 2016-08-26 MED ORDER — TETRACAINE HCL 0.5 % OP SOLN
OPHTHALMIC | Status: AC
  Filled 2016-08-26: qty 4

## 2016-08-26 MED ORDER — FLUORESCEIN SODIUM 0.6 MG OP STRP
ORAL_STRIP | OPHTHALMIC | Status: AC
  Filled 2016-08-26: qty 1

## 2016-08-26 NOTE — ED Triage Notes (Signed)
Patient was using a skill saw, no protective eye wear and felt something in left eye.  Patient says eye hurts, but vision is clear, denies blurriness, denies any black fields of vision

## 2017-09-17 DIAGNOSIS — S82891C Other fracture of right lower leg, initial encounter for open fracture type IIIA, IIIB, or IIIC: Secondary | ICD-10-CM | POA: Insufficient documentation

## 2017-09-17 DIAGNOSIS — S82141A Displaced bicondylar fracture of right tibia, initial encounter for closed fracture: Secondary | ICD-10-CM | POA: Insufficient documentation

## 2018-02-27 NOTE — Progress Notes (Signed)
 ORTHOPAEDIC TRAUMA RETURN CLINIC NOTE  Date: 02/27/2018  Attending: Dr. Ernestine   ASSESSMENT: Gerald Logan is a 38 y.o. male with the following:  Injury: Right ankle and right lateral tibial plateau fractures Date of injury: 09/16/2017 Surgery: ORIF of right bimalleolar ankle fracture, ORIF of lateral tibial plateau fracture with single partially-threaded screw Date of surgery: 09/18/2017  PLAN: We had a good discussion with the patient today.  He is progressing quite well.  We do recommend that he obtain an over-the-counter lace up ankle brace from [employer] as he does intermittently twist his ankle.  We also provided the patient with a work release for full duty.  We will follow-up on as-needed basis  Requested Prescriptions    No prescriptions requested or ordered in this encounter    Scheduling Notes: Follow up: Return if symptoms worsen or fail to improve.   SUBJECTIVE: Chief complaint: Follow-up    History of Present Illness:  38 y.o. male who is for routine follow-up of his above-mentioned injuries.  The patient notes that he is doing much better than his last visit.  He states that his pain is adequately controlled at this time.  He has been doing home exercises for range of motion strengthening of his right knee and ankle.  He states that he is slightly twisted his ankle this morning, which is why it is swollen.  Prior to that it was the same size as his left ankle.  He has not been requiring any sort of assist device.  He is interested in returning to work at this point.     Review of Systems No fevers, no SOB   Patient Active Problem List  Diagnosis   Type III open fracture of right ankle   Closed fracture of right tibial plateau    OBJECTIVE: EXPANDED PHYSICAL EXAM (6 Point) General Appearance well-nourished and no acute distress, Estimated body mass index is 31.19 kg/m as calculated from the following:   Height as of 09/16/17: 182.9 cm (6').   Weight as of 09/16/17:  104.3 kg (230 lb).  Mood and Affect alert, cooperative and pleasant  Cardiovascular well-perfused distally and no swelling  Sensation sensation to light touch distally normal   MUSCULOSKELETAL   Right lower extremity  Incisions well-healed.  Nontender palpation about the knee or ankle.  Symmetric knee range of motion compared to contralateral side.  Palpable patellofemoral crepitation.  Right ankle 15 degrees dorsiflexion and 20 degrees plantarflexion with knee extended.  Strength 4+/5.  Sensation intact light touch in the sural, saphenous, superficial peroneal, deep peroneal, and tibial nerve distributions.  Toes are warm and well-perfused with brisk cap refill.  Ambulates with a minimally antalgic gait.   Test Results Imaging No new films today.    No orders of the defined types were placed in this encounter.   Note created by [name] Feeling, February 27, 2018 9:49 AM

## 2020-09-15 ENCOUNTER — Ambulatory Visit (HOSPITAL_COMMUNITY)
Admission: EM | Admit: 2020-09-15 | Discharge: 2020-09-15 | Disposition: A | Discharge status: Home / Self Care | Payer: Self-pay | Attending: Family Medicine | Admitting: Family Medicine

## 2020-09-15 ENCOUNTER — Other Ambulatory Visit: Payer: Self-pay

## 2020-09-15 ENCOUNTER — Encounter (HOSPITAL_COMMUNITY): Payer: Self-pay | Admitting: Emergency Medicine

## 2020-09-15 DIAGNOSIS — H1032 Unspecified acute conjunctivitis, left eye: Secondary | ICD-10-CM

## 2020-09-15 MED ORDER — ERYTHROMYCIN 5 MG/GM OP OINT: TOPICAL_OINTMENT | OPHTHALMIC | 0 refills | Status: AC

## 2020-09-15 NOTE — ED Triage Notes (Signed)
Pt said today he woke up with irritation to left eye and is watering. Very painful No injury

## 2020-09-19 NOTE — ED Provider Notes (Signed)
[facility]    CSN: [id number] Arrival date & time: 09/15/20  1929      History   Chief Complaint Chief Complaint  Patient presents with   Eye Pain    HPI Gerald Logan is a 40 y.o. male.   Medical interpreter utilized to facilitate visit with patient's consent.  He states he woke up this morning with left eye irritation, clear watering, itching.  He denies any injury or substances to the eye.  Some photophobia, no headache, nausea, vomiting, blurry vision, floaters, fevers.  Has had something like this several years ago and was treated at an urgent care at that time as well.   Past Medical History:  Diagnosis Date   Rupture of distal biceps tendon 07/2014   right    There are no problems to display for this patient.   Past Surgical History:  Procedure Laterality Date   DISTAL BICEPS TENDON REPAIR Right 08/06/2014   Procedure: RIGHT DISTAL BICEPS TENDON REPAIR;  Surgeon: Sheral Apley, MD;  Location: [facility];  Service: Orthopedics;  Laterality: Right;   NO PAST SURGERIES       Home Medications    Prior to Admission medications   Medication Sig Start Date End Date Taking? Authorizing Provider  erythromycin ophthalmic ointment Place a 1/2 inch ribbon of ointment into the left lower eyelid BID. 09/15/20  Yes Particia Nearing, PA-C  aspirin EC 81 MG tablet Take 1 tablet (81 mg total) by mouth daily. 08/06/14   Janalee Dane, PA-C  docusate sodium (COLACE) 100 MG capsule Take 1 capsule (100 mg total) by mouth 2 (two) times daily. 08/06/14   Janalee Dane, PA-C  methocarbamol (ROBAXIN) 500 MG tablet Take 1 tablet (500 mg total) by mouth 4 (four) times daily. 08/06/14   Janalee Dane, PA-C  ondansetron (ZOFRAN) 4 MG tablet Take 1 tablet (4 mg total) by mouth every 8 (eight) hours as needed for nausea or vomiting. 08/06/14   Janalee Dane, PA-C  oxyCODONE-acetaminophen (PERCOCET) 5-325 MG per tablet Take 1-2 tablets by mouth every 4 (four)  hours as needed for severe pain. 08/06/14   Janalee Dane, PA-C    Family History History reviewed. No pertinent family history.  Social History Social History   Tobacco Use   Smoking status: Every Day    Years: 0.50    Types: Cigarettes   Smokeless tobacco: Never   Tobacco comments:    1-2 cig./week  Substance Use Topics   Alcohol use: Yes    Comment: socially   Drug use: No     Allergies   Patient has no known allergies.   Review of Systems Review of Systems Per HPI  Physical Exam Triage Vital Signs ED Triage Vitals  Enc Vitals Group     BP 09/15/20 2013 113/90     Pulse Rate 09/15/20 2013 84     Resp 09/15/20 2013 16     Temp --      Temp src --      SpO2 09/15/20 2013 98 %     Weight --      Height --      Head Circumference --      Peak Flow --      Pain Score 09/15/20 2014 2     Pain Loc --      Pain Edu? --      Excl. in GC? --    No data found.  Updated Vital Signs BP 113/90 (  BP Location: Right Arm)   Pulse 84   Resp 16   SpO2 98%   Visual Acuity Right Eye Distance: 20/20 Left Eye Distance: 20/25 Bilateral Distance:    Right Eye Near:   Left Eye Near:    Bilateral Near:     Physical Exam Vitals and nursing note reviewed.  Constitutional:      Appearance: Normal appearance.  HENT:     Head: Atraumatic.     Nose: Nose normal.     Mouth/Throat:     Mouth: Mucous membranes are moist.     Pharynx: Oropharynx is clear.  Eyes:     General:        Left eye: Discharge present.    Extraocular Movements: Extraocular movements intact.     Pupils: Pupils are equal, round, and reactive to light.     Comments: Diffuse left conjunctival erythema, injection.  Clear discharge present  Cardiovascular:     Rate and Rhythm: Normal rate and regular rhythm.  Pulmonary:     Effort: Pulmonary effort is normal.     Breath sounds: Normal breath sounds.  Musculoskeletal:        General: Normal range of motion.     Cervical back: Normal range of  motion and neck supple.  Skin:    General: Skin is warm and dry.  Neurological:     General: No focal deficit present.     Mental Status: He is oriented to person, place, and time.  Psychiatric:        Mood and Affect: Mood normal.        Thought Content: Thought content normal.        Judgment: Judgment normal.     UC Treatments / Results  Labs (all labs ordered are listed, but only abnormal results are displayed) Labs Reviewed - No data to display  EKG   Radiology No results found.  Procedures Procedures (including critical care time)  Medications Ordered in UC Medications - No data to display  Initial Impression / Assessment and Plan / UC Course  I have reviewed the triage vital signs and the nursing notes.  Pertinent labs & imaging results that were available during my care of the patient were reviewed by me and considered in my medical decision making (see chart for details).     Vital signs, visual acuity, overall exam reassuring.  Suspect infectious cause, will treat with erythromycin ointment, over-the-counter lubricating drops, warm compresses.  Close follow-up with eye specialist if not fully resolving in the next few days.  ED if worsening at any time.  Final Clinical Impressions(s) / UC Diagnoses   Final diagnoses:  Acute bacterial conjunctivitis of left eye   Discharge Instructions   None    ED Prescriptions     Medication Sig Dispense Auth. Provider   erythromycin ophthalmic ointment Place a 1/2 inch ribbon of ointment into the left lower eyelid BID. 3.5 g Particia Nearing, PA-C      PDMP not reviewed this encounter.   Roosvelt Maser [place], [place] 09/19/20 [phone]

## 2021-06-27 ENCOUNTER — Other Ambulatory Visit: Payer: Self-pay | Admitting: Nurse Practitioner

## 2021-06-27 ENCOUNTER — Ambulatory Visit
Admission: RE | Admit: 2021-06-27 | Discharge: 2021-06-27 | Disposition: A | Discharge status: Home / Self Care | Payer: No Typology Code available for payment source | Source: Ambulatory Visit | Attending: Nurse Practitioner | Admitting: Nurse Practitioner

## 2021-06-27 DIAGNOSIS — R059 Cough, unspecified: Secondary | ICD-10-CM

## 2021-06-27 DIAGNOSIS — R0602 Shortness of breath: Secondary | ICD-10-CM

## 2022-08-16 IMAGING — CR DG CHEST 2V
2 series · 2 of 2 positions shown · non-contrast
Comparison: 05/12/2008

CLINICAL DATA: Cough and shortness of breath for 5 days.

EXAM:
CHEST - 2 VIEW

[w chest pa]
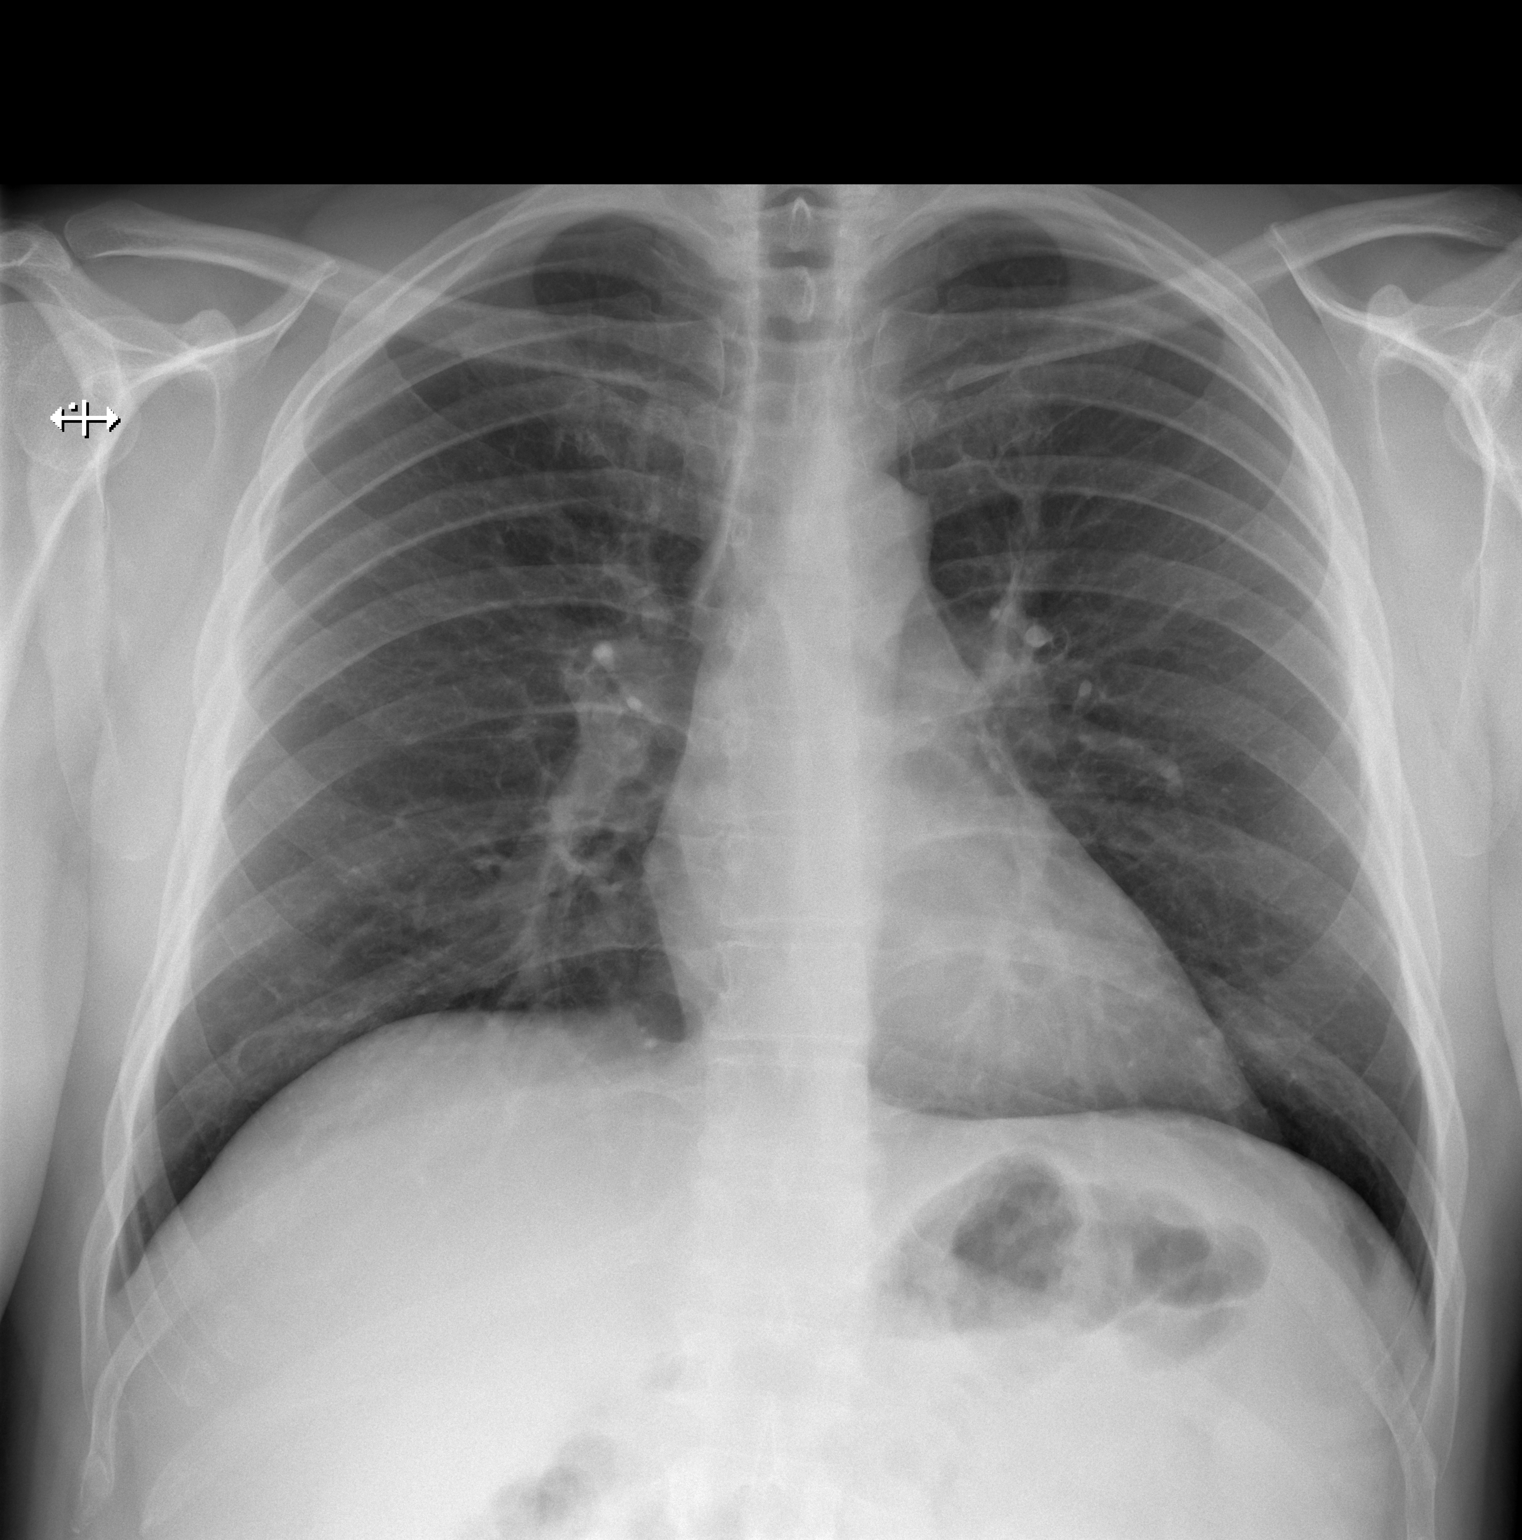

[w chest lat]
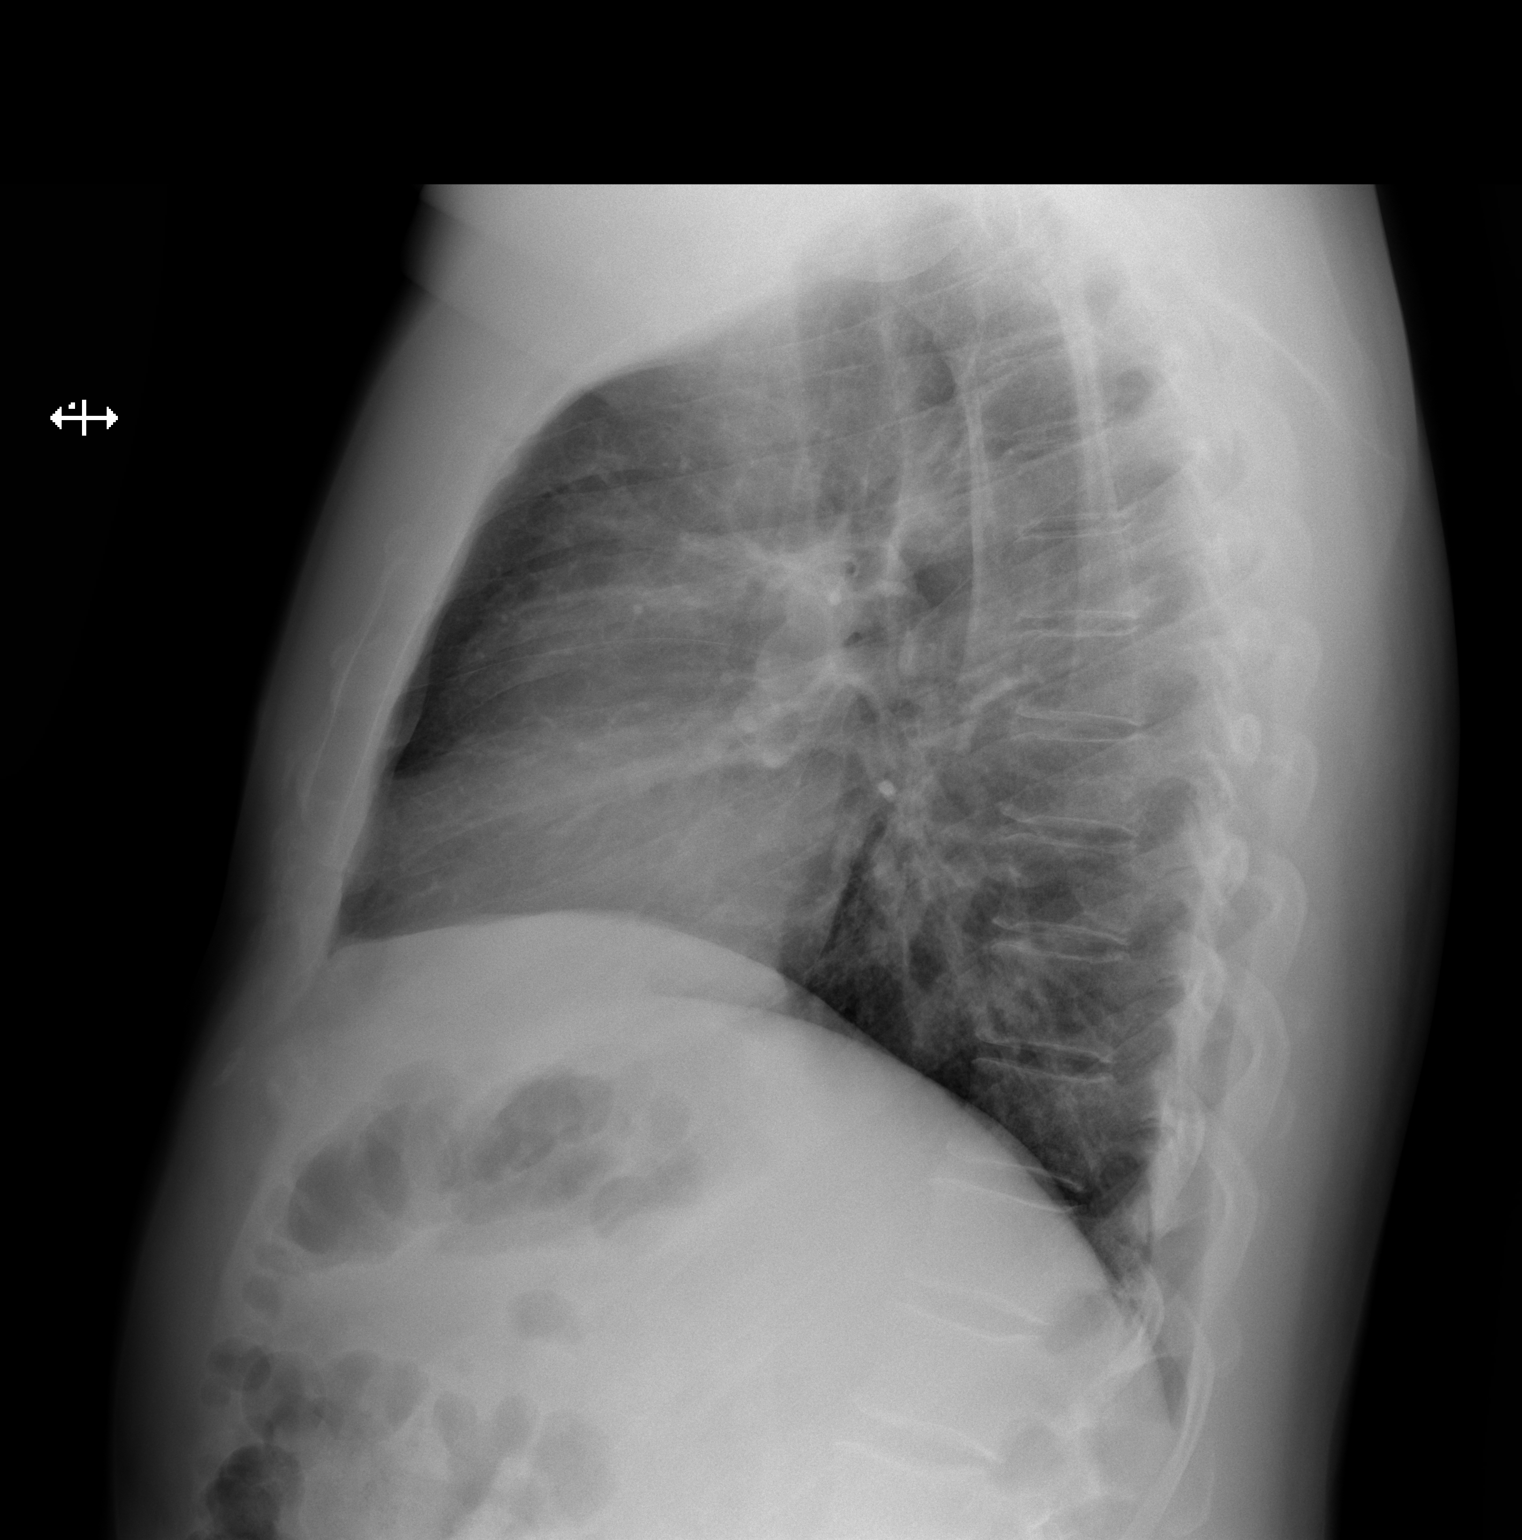

[2 of 2 positions shown; findings below may reference images not displayed]

FINDINGS: The heart size and mediastinal contours are within normal limits.
Both lungs are clear. The visualized skeletal structures are
unremarkable.
IMPRESSION: No active cardiopulmonary disease.

## 2024-02-21 ENCOUNTER — Encounter: Payer: Self-pay | Admitting: Emergency Medicine

## 2024-02-21 ENCOUNTER — Other Ambulatory Visit: Payer: Self-pay

## 2024-02-21 ENCOUNTER — Ambulatory Visit
Admission: EM | Admit: 2024-02-21 | Discharge: 2024-02-21 | Disposition: A | Discharge status: Home / Self Care | Payer: Self-pay

## 2024-02-21 DIAGNOSIS — J101 Influenza due to other identified influenza virus with other respiratory manifestations: Secondary | ICD-10-CM

## 2024-02-21 LAB — POCT INFLUENZA A/B
Influenza A, POC: POSITIVE — AB
Influenza B, POC: NEGATIVE

## 2024-02-21 MED ORDER — BENZONATATE 100 MG PO CAPS: 100.0000 mg | ORAL_CAPSULE | Freq: Three times a day (TID) | ORAL | 0 refills | Status: AC

## 2024-02-21 MED ORDER — OSELTAMIVIR PHOSPHATE 75 MG PO CAPS: 75.0000 mg | ORAL_CAPSULE | Freq: Two times a day (BID) | ORAL | 0 refills | Status: AC

## 2024-02-21 NOTE — ED Provider Notes (Signed)
 " [facility]    CSN: [id number] Arrival date & time: 02/21/24  1514      History   Chief Complaint Chief Complaint  Patient presents with   URI    HPI Gerald Logan is a 44 y.o. male.   Pt presents today due to fever (highest temp 102) and uncontrollable cough. Pt states that he has been experiencing body aches, weakness, dizziness, and reduced appetite. Pt states that he has been using Advil for fever and body aches.   The history is provided by the patient.  URI   Past Medical History:  Diagnosis Date   Rupture of distal biceps tendon 07/2014   right    Patient Active Problem List   Diagnosis Date Noted   Closed fracture of right tibial plateau 09/17/2017   Type III open fracture of right ankle 09/17/2017    Past Surgical History:  Procedure Laterality Date   DISTAL BICEPS TENDON REPAIR Right 08/06/2014   Procedure: RIGHT DISTAL BICEPS TENDON REPAIR;  Surgeon: Evalene JONETTA Chancy, MD;  Location: [facility];  Service: Orthopedics;  Laterality: Right;   NO PAST SURGERIES         Home Medications    Prior to Admission medications  Medication Sig Start Date End Date Taking? Authorizing Provider  benzonatate  (TESSALON ) 100 MG capsule Take 1 capsule (100 mg total) by mouth every 8 (eight) hours. 02/21/24  Yes Andra Krabbe C, PA-C  gabapentin (NEURONTIN) 100 MG capsule Take 100 mg by mouth. 09/19/17  Yes [provider]  ibuprofen (ADVIL) 600 MG tablet Take 1,800 mg by mouth every 6 (six) hours as needed for headache or fever.   Yes [provider]  oseltamivir  (TAMIFLU ) 75 MG capsule Take 1 capsule (75 mg total) by mouth every 12 (twelve) hours. 02/21/24  Yes Andra Krabbe BROCKS, PA-C  allopurinol (ZYLOPRIM) 100 MG tablet Take 100 mg by mouth.    [provider]  aspirin  EC 81 MG tablet Take 1 tablet (81 mg total) by mouth daily. 08/06/14   Burnard Regan, PA-C  docusate sodium  (COLACE) 100 MG capsule Take 1  capsule (100 mg total) by mouth 2 (two) times daily. 08/06/14   Burnard Regan, PA-C  erythromycin  ophthalmic ointment Place a 1/2 inch ribbon of ointment into the left lower eyelid BID. 09/15/20   Stuart Vernell Norris, PA-C  fenofibrate (TRICOR) 145 MG tablet Take 145 mg by mouth.    [provider]  methocarbamol  (ROBAXIN ) 500 MG tablet Take 1 tablet (500 mg total) by mouth 4 (four) times daily. 08/06/14   Burnard Regan, PA-C  ondansetron  (ZOFRAN ) 4 MG tablet Take 1 tablet (4 mg total) by mouth every 8 (eight) hours as needed for nausea or vomiting. 08/06/14   Burnard Regan, PA-C  oxyCODONE -acetaminophen  (PERCOCET) 5-325 MG per tablet Take 1-2 tablets by mouth every 4 (four) hours as needed for severe pain. 08/06/14   Burnard Regan, PA-C    Family History History reviewed. No pertinent family history.  Social History Social History[1]   Allergies   Patient has no known allergies.   Review of Systems Review of Systems   Physical Exam Triage Vital Signs ED Triage Vitals  Encounter Vitals Group     BP 02/21/24 1615 99/67     Girls Systolic BP Percentile --      Girls Diastolic BP Percentile --      Boys Systolic BP Percentile --      Boys Diastolic BP Percentile --  Pulse Rate 02/21/24 1615 (!) 118     Resp 02/21/24 1615 20     Temp 02/21/24 1615 99.6 F (37.6 C)     Temp Source 02/21/24 1615 Oral     SpO2 02/21/24 1615 97 %     Weight 02/21/24 1614 210 lb 1.6 oz (95.3 kg)     Height --      Head Circumference --      Peak Flow --      Pain Score 02/21/24 1613 5     Pain Loc --      Pain Education --      Exclude from Growth Chart --    No data found.  Updated Vital Signs BP 99/67 (BP Location: Left Arm)   Pulse (!) 118   Temp 99.6 F (37.6 C) (Oral)   Resp 20   Wt 210 lb 1.6 oz (95.3 kg)   SpO2 97%   BMI 28.49 kg/m   Visual Acuity Right Eye Distance:   Left Eye Distance:   Bilateral Distance:    Right Eye Near:   Left Eye Near:     Bilateral Near:     Physical Exam Vitals and nursing note reviewed.  Constitutional:      General: He is not in acute distress.    Appearance: Normal appearance. He is ill-appearing. He is not toxic-appearing or diaphoretic.  HENT:     Nose: Congestion (moderately enlarged turbinates) present. No rhinorrhea.     Mouth/Throat:     Mouth: Mucous membranes are moist.     Pharynx: Oropharynx is clear. No oropharyngeal exudate or posterior oropharyngeal erythema.  Eyes:     General: No scleral icterus. Cardiovascular:     Rate and Rhythm: Normal rate and regular rhythm.     Heart sounds: Normal heart sounds.  Pulmonary:     Effort: Pulmonary effort is normal. No respiratory distress.     Breath sounds: Normal breath sounds. No wheezing or rhonchi.  Skin:    General: Skin is warm.  Neurological:     Mental Status: He is alert and oriented to person, place, and time.  Psychiatric:        Mood and Affect: Mood normal.        Behavior: Behavior normal.      UC Treatments / Results  Labs (all labs ordered are listed, but only abnormal results are displayed) Labs Reviewed  POCT INFLUENZA A/B - Abnormal; Notable for the following components:      Result Value   Influenza A, POC Positive (*)    All other components within normal limits    EKG   Radiology No results found.  Procedures Procedures (including critical care time)  Medications Ordered in UC Medications - No data to display  Initial Impression / Assessment and Plan / UC Course  I have reviewed the triage vital signs and the nursing notes.  Pertinent labs & imaging results that were available during my care of the patient were reviewed by me and considered in my medical decision making (see chart for details).      Final Clinical Impressions(s) / UC Diagnoses   Final diagnoses:  Influenza A     Discharge Instructions      Will send Tamiflu  to pharmacy to be started today along with Tessalon  for  cough.     ED Prescriptions     Medication Sig Dispense Auth. Provider   oseltamivir  (TAMIFLU ) 75 MG capsule Take 1 capsule (75 mg total)  by mouth every 12 (twelve) hours. 10 capsule Andra Corean BROCKS, PA-C   benzonatate  (TESSALON ) 100 MG capsule Take 1 capsule (100 mg total) by mouth every 8 (eight) hours. 30 capsule Andra Corean BROCKS, PA-C      PDMP not reviewed this encounter.    [1]  Social History Tobacco Use   Smoking status: Every Day    Types: Cigarettes    Passive exposure: Current   Smokeless tobacco: Never   Tobacco comments:    1-2 cig./week  Vaping Use   Vaping status: Never Used  Substance Use Topics   Alcohol use: Yes    Comment: socially   Drug use: No     Andra Corean BROCKS, PA-C 02/21/24 1827  "

## 2024-02-21 NOTE — ED Triage Notes (Signed)
 Pt presents c/o URI x 2 days. Pt states,  It started yesterday with fever and uncontrollable cough. Today it has been fever. I took Advil around 1:30 pm for the fever. My whole body hurts and I feel weak and dizzy.  Pt denies diarrhea but does c/o emesis. Pt also reports he hasn't eaten since yesterday due to lack of appetite.

## 2024-02-21 NOTE — Discharge Instructions (Signed)
 Will send Tamiflu  to pharmacy to be started today along with Tessalon  for cough.
